# Patient Record
Sex: Male | Born: 1990 | Race: Black or African American | Hispanic: No | Marital: Single | Smoking: Never smoker
Health system: Southern US, Community
[De-identification: ages and names within clinical notes are randomized; demographics above are authoritative.]

---
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
## 2015-04-04 ENCOUNTER — Emergency Department (HOSPITAL_COMMUNITY): Payer: BLUE CROSS/BLUE SHIELD

## 2015-04-04 ENCOUNTER — Emergency Department (HOSPITAL_COMMUNITY)
Admission: EM | Admit: 2015-04-04 | Discharge: 2015-04-04 | Disposition: A | Discharge status: Home / Self Care | Payer: BLUE CROSS/BLUE SHIELD | Attending: Emergency Medicine | Admitting: Emergency Medicine

## 2015-04-04 ENCOUNTER — Encounter (HOSPITAL_COMMUNITY): Payer: Self-pay | Admitting: *Deleted

## 2015-04-04 DIAGNOSIS — S0990XA Unspecified injury of head, initial encounter: Secondary | ICD-10-CM | POA: Insufficient documentation

## 2015-04-04 DIAGNOSIS — S161XXA Strain of muscle, fascia and tendon at neck level, initial encounter: Secondary | ICD-10-CM | POA: Insufficient documentation

## 2015-04-04 DIAGNOSIS — R519 Headache, unspecified: Secondary | ICD-10-CM

## 2015-04-04 DIAGNOSIS — Y998 Other external cause status: Secondary | ICD-10-CM | POA: Insufficient documentation

## 2015-04-04 DIAGNOSIS — Y92410 Unspecified street and highway as the place of occurrence of the external cause: Secondary | ICD-10-CM | POA: Diagnosis not present

## 2015-04-04 DIAGNOSIS — R51 Headache: Secondary | ICD-10-CM

## 2015-04-04 DIAGNOSIS — Y9389 Activity, other specified: Secondary | ICD-10-CM | POA: Diagnosis not present

## 2015-04-04 MED ORDER — CYCLOBENZAPRINE HCL 10 MG PO TABS: 10.0000 mg | ORAL_TABLET | Freq: Two times a day (BID) | ORAL | Status: AC | PRN

## 2015-04-04 MED ORDER — KETOROLAC TROMETHAMINE 60 MG/2ML IM SOLN
30.0000 mg | Freq: Once | INTRAMUSCULAR | Status: AC
  Administered 2015-04-04: 30 mg via INTRAMUSCULAR
  Filled 2015-04-04: qty 2

## 2015-04-04 MED ORDER — HYDROCODONE-ACETAMINOPHEN 5-325 MG PO TABS: 1.0000 | ORAL_TABLET | ORAL | Status: DC | PRN

## 2015-04-04 MED ORDER — CYCLOBENZAPRINE HCL 10 MG PO TABS
10.0000 mg | ORAL_TABLET | Freq: Once | ORAL | Status: AC
Start: 2015-04-04 — End: 2015-04-04
  Administered 2015-04-04: 10 mg via ORAL
  Filled 2015-04-04: qty 1

## 2015-04-04 MED ORDER — NAPROXEN 500 MG PO TABS: 500.0000 mg | ORAL_TABLET | Freq: Two times a day (BID) | ORAL | Status: AC
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
## 2015-04-04 NOTE — Discharge Instructions (Signed)
Do not take the narcotic or the muscle relaxant if you are driving because it will make you sleepy. If your headache persist return for further evaluation.
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
## 2016-03-29 ENCOUNTER — Encounter (HOSPITAL_COMMUNITY): Payer: Self-pay

## 2016-03-29 ENCOUNTER — Emergency Department (HOSPITAL_COMMUNITY): Payer: BLUE CROSS/BLUE SHIELD

## 2016-03-29 ENCOUNTER — Emergency Department (HOSPITAL_COMMUNITY)
Admission: EM | Admit: 2016-03-29 | Discharge: 2016-03-29 | Disposition: A | Discharge status: Home / Self Care | Payer: BLUE CROSS/BLUE SHIELD | Attending: Emergency Medicine | Admitting: Emergency Medicine

## 2016-03-29 DIAGNOSIS — Y92310 Basketball court as the place of occurrence of the external cause: Secondary | ICD-10-CM | POA: Diagnosis not present

## 2016-03-29 DIAGNOSIS — Y9367 Activity, basketball: Secondary | ICD-10-CM | POA: Diagnosis not present

## 2016-03-29 DIAGNOSIS — S93402A Sprain of unspecified ligament of left ankle, initial encounter: Secondary | ICD-10-CM | POA: Insufficient documentation

## 2016-03-29 DIAGNOSIS — W19XXXA Unspecified fall, initial encounter: Secondary | ICD-10-CM | POA: Diagnosis not present

## 2016-03-29 DIAGNOSIS — Y999 Unspecified external cause status: Secondary | ICD-10-CM | POA: Insufficient documentation

## 2016-03-29 DIAGNOSIS — M25572 Pain in left ankle and joints of left foot: Secondary | ICD-10-CM | POA: Diagnosis present

## 2016-03-29 MED ORDER — IBUPROFEN 800 MG PO TABS
800.0000 mg | ORAL_TABLET | Freq: Once | ORAL | Status: AC
Start: 2016-03-29 — End: 2016-03-29
  Administered 2016-03-29: 800 mg via ORAL
  Filled 2016-03-29: qty 1

## 2016-03-29 MED ORDER — ACETAMINOPHEN 325 MG PO TABS
650.0000 mg | ORAL_TABLET | Freq: Once | ORAL | Status: AC
  Administered 2016-03-29: 650 mg via ORAL
  Filled 2016-03-29: qty 2

## 2016-03-29 MED ORDER — DICLOFENAC SODIUM 75 MG PO TBEC: 75.0000 mg | DELAYED_RELEASE_TABLET | Freq: Two times a day (BID) | ORAL | Status: AC

## 2016-03-29 MED ORDER — HYDROCODONE-ACETAMINOPHEN 5-325 MG PO TABS: 1.0000 | ORAL_TABLET | ORAL | Status: AC | PRN

## 2016-03-29 NOTE — ED Notes (Signed)
I was playing ball and landed on my left ankle wrong and it hurts.

## 2016-03-29 NOTE — ED Provider Notes (Signed)
CSN: [id number]     Arrival date & time 03/29/16  2108 History   First MD Initiated Contact with Patient 03/29/16 2139     Chief Complaint  Patient presents with  . Ankle Pain     (Consider location/radiation/quality/duration/timing/severity/associated sxs/prior Treatment) HPI  History reviewed. No pertinent past medical history. History reviewed. No pertinent past surgical history. No family history on file. Social History  Substance Use Topics  . Smoking status: Never Smoker   . Smokeless tobacco: None  . Alcohol Use: No    Review of Systems    Allergies  Review of patient's allergies indicates no known allergies.  Home Medications   Prior to Admission medications   Medication Sig Start Date End Date Taking? Authorizing Provider  cyclobenzaprine (FLEXERIL) 10 MG tablet Take 1 tablet (10 mg total) by mouth 2 (two) times daily as needed for muscle spasms. 04/04/15   Hope Orlene Och, NP  diclofenac (VOLTAREN) 75 MG EC tablet Take 1 tablet (75 mg total) by mouth 2 (two) times daily. 03/29/16   Ivery Quale, PA-C  HYDROcodone-acetaminophen (NORCO/VICODIN) 5-325 MG tablet Take 1 tablet by mouth every 4 (four) hours as needed. 03/29/16   Ivery Quale, PA-C  naproxen (NAPROSYN) 500 MG tablet Take 1 tablet (500 mg total) by mouth 2 (two) times daily. 04/04/15   Hope Orlene Och, NP   BP 119/66 mmHg  Pulse 71  Temp(Src) 98.5 F (36.9 C) (Oral)  Resp 16  Ht  (1.88 m)  Wt 65.772 kg  BMI 18.61 kg/m2  SpO2 100% Physical Exam  Constitutional: He is oriented to person, place, and time. He appears well-developed and well-nourished.  Non-toxic appearance.  HENT:  Head: Normocephalic.  Right Ear: Tympanic membrane and external ear normal.  Left Ear: Tympanic membrane and external ear normal.  Eyes: EOM and lids are normal. Pupils are equal, round, and reactive to light.  Neck: Normal range of motion. Neck supple. Carotid bruit is not present.  Cardiovascular: Normal rate, regular  rhythm, normal heart sounds, intact distal pulses and normal pulses.   Pulmonary/Chest: Breath sounds normal. No respiratory distress.  Abdominal: Soft. Bowel sounds are normal. There is no tenderness. There is no guarding.  Musculoskeletal: Normal range of motion.  There is good range of motion of the left hip and knee. There is no bony deformity of the tibial area. There is soft tissue deformity of the lateral malleolus on the left. There is tenderness to palpation, and tenderness with attempted range of motion of the left ankle. The Achilles tendon is intact. The dorsalis pedis pulses 2+.  Lymphadenopathy:       Head (right side): No submandibular adenopathy present.       Head (left side): No submandibular adenopathy present.    He has no cervical adenopathy.  Neurological: He is alert and oriented to person, place, and time. He has normal strength. No cranial nerve deficit or sensory deficit.  Skin: Skin is warm and dry.  Psychiatric: He has a normal mood and affect. His speech is normal.  Nursing note and vitals reviewed.   ED Course  Procedures (including critical care time) Labs Review Labs Reviewed - No data to display  Imaging Review Dg Ankle Complete Left  03/29/2016  CLINICAL DATA:  Landed wrong LEFT ankle while playing basketball, injury, pain EXAM: LEFT ANKLE COMPLETE - 3+ VIEW COMPARISON:  None FINDINGS: Soft tissue swelling laterally and anteriorly. Osseous mineralization normal. Ankle mortise intact. No acute fracture, dislocation, or bone destruction.  IMPRESSION: Soft tissue swelling without acute bony abnormality. Electronically Signed   By: Ulyses SouthwardMark  Boles M.D.   On: 03/29/2016 21:38   I have personally reviewed and evaluated these images and lab results as part of my medical decision-making.   EKG Interpretation None      MDM  The x-ray is negative for fracture or dislocation. There is noted soft tissue swelling. The examination favors ankle sprain. The patient is  fitted with an ankle stirrup splint and crutches, as well as an ice pack. He is referred to orthopedics for additional evaluation and management. The Achilles tendon is intact, and shows no evidence of tendon rupture. There is no neurovascular compromise appreciated.    Final diagnoses:  Ankle sprain, left, initial encounter    *I have reviewed nursing notes, vital signs, and all appropriate lab and imaging results for this patient.[address]**    Lawarence Meek, PA-C 03/29/16 2210  Rolland PorterMark James, MD 04/09/16 [phone]

## 2016-03-29 NOTE — Discharge Instructions (Signed)
Your x-ray is negative for fracture or dislocation. Your examination favors ankle sprain. Please apply ice, and keep your foot elevated above your waist is much as possible. Please use diclofenac 2 times daily with food. May use Norco for more severe pain. Norco may cause drowsiness, please use this medication with caution. Please see the orthopedic specialist listed above if not improving. Ankle Sprain An ankle sprain is an injury to the strong, fibrous tissues (ligaments) that hold your ankle bones together.  HOME CARE   Put ice on your ankle for 1-2 days or as told by your doctor.  Put ice in a plastic bag.  Place a towel between your skin and the bag.  Leave the ice on for 15-20 minutes at a time, every 2 hours while you are awake.  Only take medicine as told by your doctor.  Raise (elevate) your injured ankle above the level of your heart as much as possible for 2-3 days.  Use crutches if your doctor tells you to. Slowly put your own weight on the affected ankle. Use the crutches until you can walk without pain.  If you have a plaster splint:  Do not rest it on anything harder than a pillow for 24 hours.  Do not put weight on it.  Do not get it wet.  Take it off to shower or bathe.  If given, use an elastic wrap or support stocking for support. Take the wrap off if your toes lose feeling (numb), tingle, or turn cold or blue.  If you have an air splint:  Add or let out air to make it comfortable.  Take it off at night and to shower and bathe.  Wiggle your toes and move your ankle up and down often while you are wearing it. GET HELP IF:  You have rapidly increasing bruising or puffiness (swelling).  Your toes feel very cold.  You lose feeling in your foot.  Your medicine does not help your pain. GET HELP RIGHT AWAY IF:   Your toes lose feeling (numb) or turn blue.  You have severe pain that is increasing. MAKE SURE YOU:   Understand these instructions.  Will  watch your condition.  Will get help right away if you are not doing well or get worse.   This information is not intended to replace advice given to you by your health care provider. Make sure you discuss any questions you have with your health care provider.   Document Released: 05/14/2008 Document Revised: 12/17/2014 Document Reviewed: 06/09/2012 Elsevier Interactive Patient Education [employer]2016 [employer]
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
## 2017-10-22 IMAGING — DX DG ANKLE COMPLETE 3+V*L*
3 series · 3 of 3 positions shown · non-contrast
Comparison: None

CLINICAL DATA: Landed wrong LEFT ankle while playing basketball,
injury, pain

EXAM:
LEFT ANKLE COMPLETE - 3+ VIEW

[ankle ap]
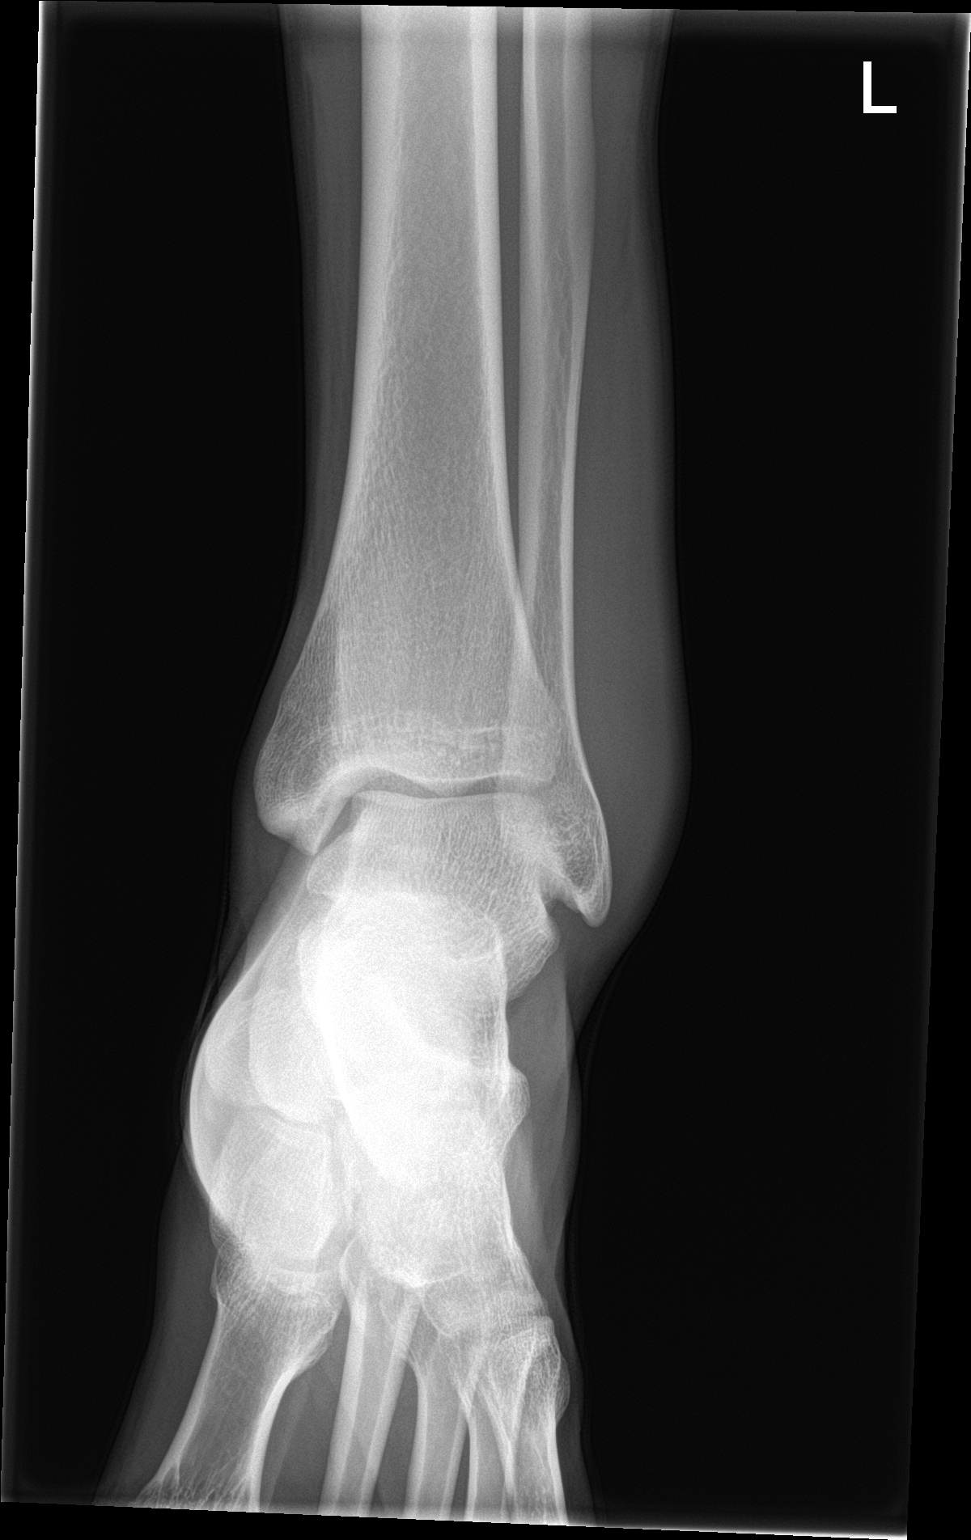

[ankle obl]
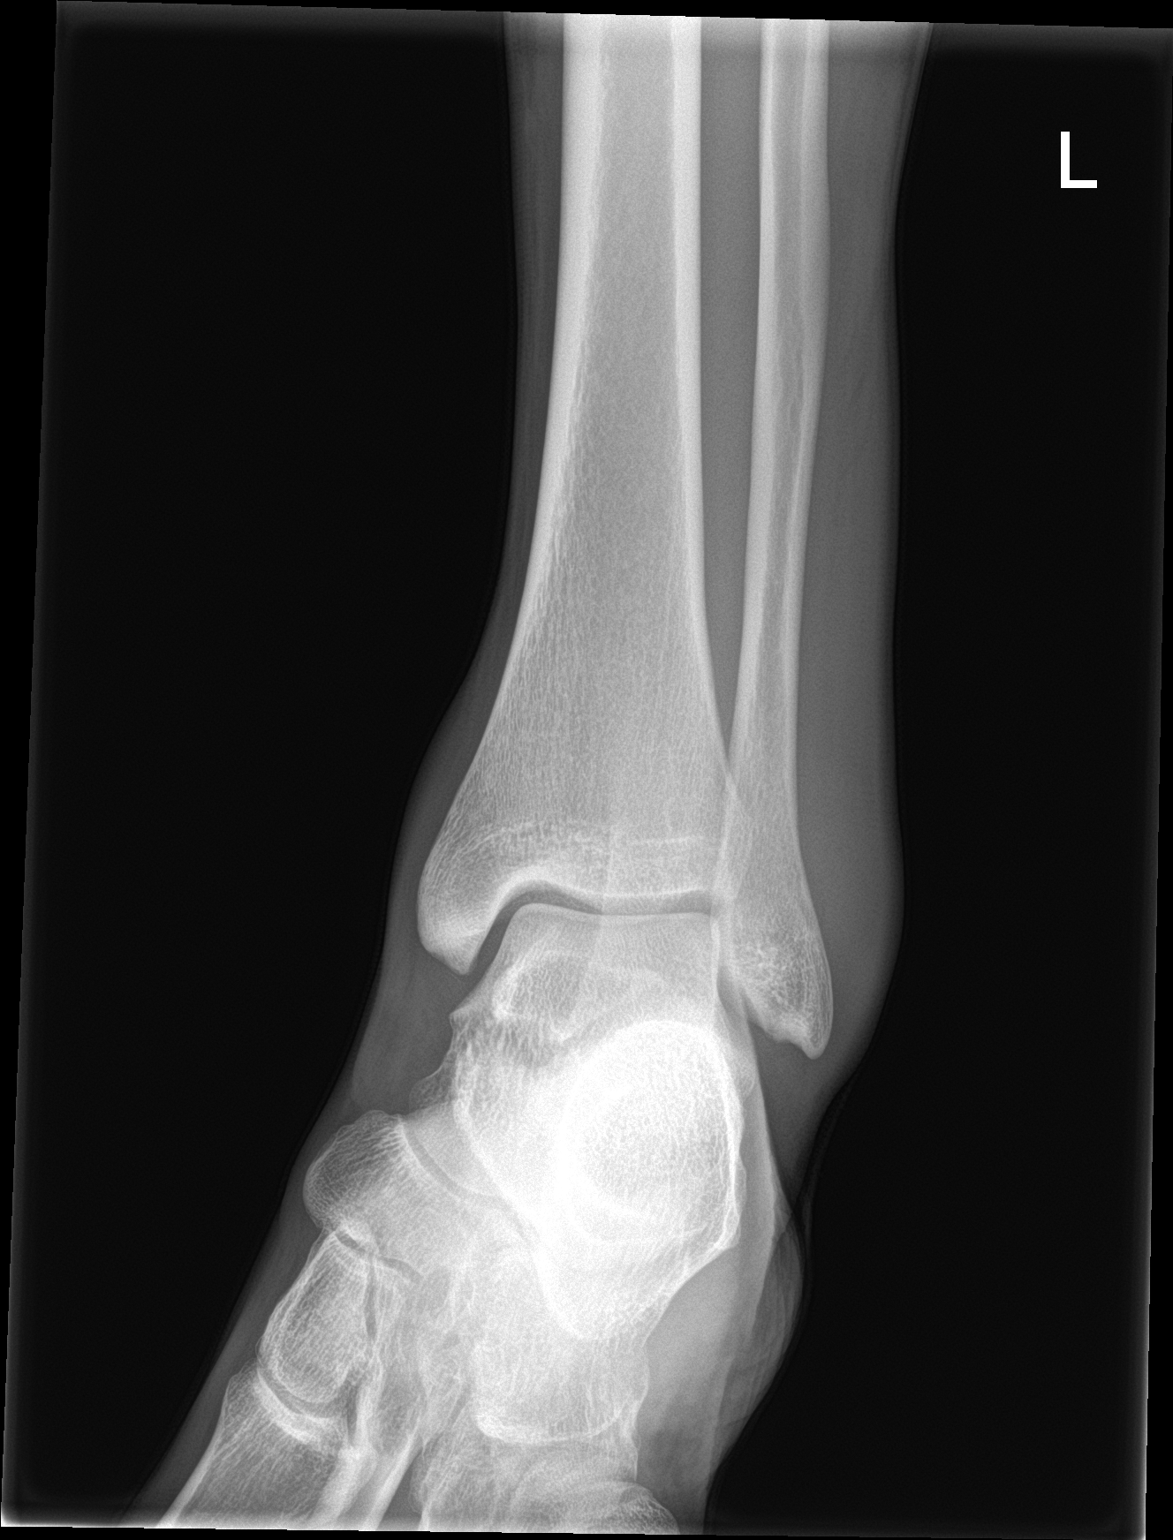

[ankle lat]
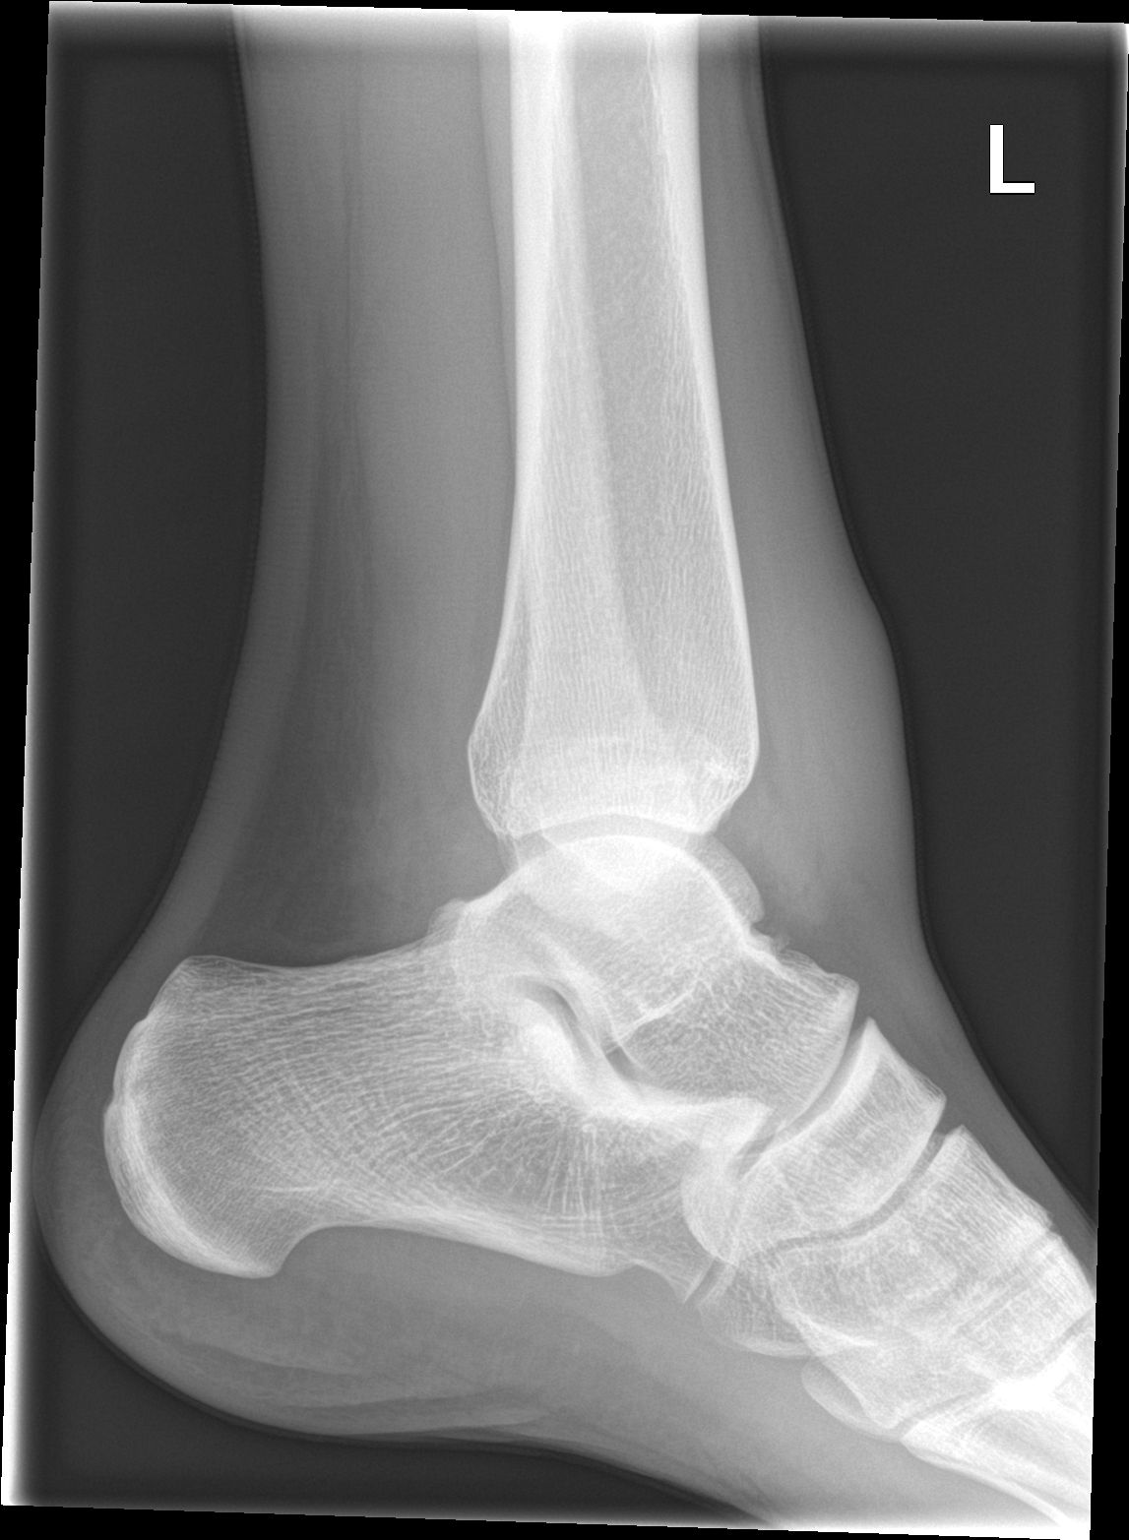

[3 of 3 positions shown; findings below may reference images not displayed]

FINDINGS: Soft tissue swelling laterally and anteriorly.

Osseous mineralization normal.

Ankle mortise intact.

No acute fracture, dislocation, or bone destruction.
IMPRESSION: Soft tissue swelling without acute bony abnormality.

## 2020-01-18 ENCOUNTER — Ambulatory Visit: Payer: BC Managed Care – PPO | Attending: Internal Medicine

## 2020-01-18 ENCOUNTER — Other Ambulatory Visit: Payer: Self-pay

## 2020-01-18 DIAGNOSIS — Z20822 Contact with and (suspected) exposure to covid-19: Secondary | ICD-10-CM

## 2020-01-19 LAB — NOVEL CORONAVIRUS, NAA: SARS-CoV-2, NAA: NOT DETECTED

## 2020-04-14 ENCOUNTER — Ambulatory Visit: Payer: BC Managed Care – PPO | Attending: Internal Medicine

## 2020-04-14 ENCOUNTER — Other Ambulatory Visit: Payer: Self-pay

## 2020-04-14 DIAGNOSIS — Z20822 Contact with and (suspected) exposure to covid-19: Secondary | ICD-10-CM

## 2020-04-15 LAB — SARS-COV-2, NAA 2 DAY TAT

## 2020-04-15 LAB — NOVEL CORONAVIRUS, NAA: SARS-CoV-2, NAA: NOT DETECTED
